# Patient Record
Sex: Female | Born: 1950 | Race: White | Hispanic: No | Marital: Single | Smoking: Never smoker
Health system: Southern US, Community
[De-identification: ages and names within clinical notes are randomized; demographics above are authoritative.]

## PROBLEM LIST (undated history)

## (undated) DIAGNOSIS — G43909 Migraine, unspecified, not intractable, without status migrainosus: Secondary | ICD-10-CM

## (undated) DIAGNOSIS — F329 Major depressive disorder, single episode, unspecified: Secondary | ICD-10-CM

## (undated) DIAGNOSIS — F32A Depression, unspecified: Secondary | ICD-10-CM

## (undated) HISTORY — DX: Depression, unspecified: F32.A

## (undated) HISTORY — PX: TUBAL LIGATION: SHX77

## (undated) HISTORY — DX: Migraine, unspecified, not intractable, without status migrainosus: G43.909

## (undated) HISTORY — DX: Major depressive disorder, single episode, unspecified: F32.9

---

## 1998-07-01 ENCOUNTER — Encounter: Payer: Self-pay | Admitting: Internal Medicine

## 1998-07-01 ENCOUNTER — Emergency Department (HOSPITAL_COMMUNITY): Admission: EM | Admit: 1998-07-01 | Discharge: 1998-07-01 | Payer: Self-pay | Admitting: Emergency Medicine

## 2002-09-13 ENCOUNTER — Other Ambulatory Visit: Admission: RE | Admit: 2002-09-13 | Discharge: 2002-09-13 | Payer: Self-pay | Admitting: *Deleted

## 2004-09-16 ENCOUNTER — Other Ambulatory Visit: Admission: RE | Admit: 2004-09-16 | Discharge: 2004-09-16 | Payer: Self-pay | Admitting: *Deleted

## 2012-11-01 ENCOUNTER — Ambulatory Visit (INDEPENDENT_AMBULATORY_CARE_PROVIDER_SITE_OTHER): Payer: BC Managed Care – PPO | Admitting: Emergency Medicine

## 2012-11-01 ENCOUNTER — Ambulatory Visit: Payer: BC Managed Care – PPO

## 2012-11-01 VITALS — BP 106/68 | HR 64 | Temp 98.2°F | Resp 16 | Ht 64.5 in | Wt 162.0 lb

## 2012-11-01 DIAGNOSIS — M549 Dorsalgia, unspecified: Secondary | ICD-10-CM

## 2012-11-01 DIAGNOSIS — M4310 Spondylolisthesis, site unspecified: Secondary | ICD-10-CM

## 2012-11-01 DIAGNOSIS — Q762 Congenital spondylolisthesis: Secondary | ICD-10-CM

## 2012-11-01 DIAGNOSIS — M62838 Other muscle spasm: Secondary | ICD-10-CM

## 2012-11-01 MED ORDER — MELOXICAM 15 MG PO TABS: 15.0000 mg | ORAL_TABLET | Freq: Every day | ORAL | Status: DC

## 2012-11-01 MED ORDER — CYCLOBENZAPRINE HCL 5 MG PO TABS: 5.0000 mg | ORAL_TABLET | Freq: Three times a day (TID) | ORAL | Status: DC | PRN

## 2012-11-01 NOTE — Patient Instructions (Signed)
Take the Mobic in the morning with food.  Use Flexeril at night before bedtime because it will make you drowsy.  We will refer you to an orthopedist and call you once we have an appointment set up for you.  If you have any questions in the meantime do not hesitate to call us.

## 2012-11-01 NOTE — Progress Notes (Signed)
I have reviewed the chart and examined patient and agree with assessment and plan.

## 2012-11-01 NOTE — Progress Notes (Signed)
Subjective:    Patient ID: Anne Nelson, female    DOB: 1951/05/04, 62 y.o.   MRN: [id number]  HPI  A 62 year old female presents with LBP for 1 week.  Pt denies any known injury or new activities in her life.  Pain was not sudden or sharp but more gradual with a constant dull pain.  Pain has progressively gotten worse.  She says her pain is worse in the morning and when she sits for long periods of time but tends to get better with activity.  The pain is starting to interfere with her ADLs.  Her pain is starting to radiate into her left anterior thigh and into her right and left groin.  Denies prior back injuries/surgeries, n/t, waking at night due to pain, knee pain.      Past Medical History  Diagnosis Date  . Depression   . Migraine     Past Surgical History  Procedure Laterality Date  . Tubal ligation    . Cesarean section      Prior to Admission medications   Medication Sig Start Date End Date Taking? Authorizing Provider  FLUoxetine (PROZAC) 20 MG capsule Take 20 mg by mouth daily.   Yes Historical Provider, MD    Allergies  Allergen Reactions  . Codeine Nausea Only    History   Social History  . Marital Status: Single    Spouse Name: N/A    Number of Children: 3  . Years of Education: 39   Occupational History  . Accountant    Social History Main Topics  . Smoking status: Never Smoker   . Smokeless tobacco: Not on file  . Alcohol Use: No  . Drug Use: No  . Sexually Active: Yes -- Female partner(s)   Other Topics Concern  . Not on file   Social History Narrative  . No narrative on file    Family History  Problem Relation Age of Onset  . Stroke Father   . Stroke Maternal Grandmother   . Stroke Maternal Grandfather   . Heart disease Paternal Grandfather      Review of Systems   As above. Objective:   Physical Exam  BP 106/68  Pulse 64  Temp(Src) 98.2 F (36.8 C) (Oral)  Resp 16  Ht 5' 4.5" (1.638 m)  Wt 162 lb (73.483 kg)  BMI  27.39 kg/m2  SpO2 99%  LMP 03/30/2006  General:  Pleasant, overweight female.  NAD. MSK:  Bilateral 2+ patellar and achilles DTRs.  Full ROM of knees, hip joints, and lumbar back.  Negative pinpoint spinal tenderness.  Tenderness lumbar paraspinal muscles.  Negative straight leg raise.   Peripheral vascular:  Bilateral 2+ dorsalis pedis pulses.  No c/c/e. Heart:  RRR.  Normal S1,S2.  No m/g/r. Lungs:  CTAB. Skin:  Warm and moist.  No ecchymoses.   [facility] reading (PRIMARY) by  Dr. Cleta Alberts.  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      Assessment & Plan:  Back pain - Plan: DG Lumbar Spine Complete, meloxicam (MOBIC) 15 MG tablet  Muscle spasm - Plan: cyclobenzaprine (FLEXERIL) 5 MG tablet  Spondylolisthesis  Patient Instructions  Take the Mobic in the morning with food.  Use Flexeril at night before bedtime because it will make you drowsy.  We will refer you to an orthopedist and call you once we have an appointment set up for you.  If you have any questions in the meantime do not hesitate to call us.

## 2013-04-23 ENCOUNTER — Emergency Department (HOSPITAL_COMMUNITY): Payer: BC Managed Care – PPO

## 2013-04-23 ENCOUNTER — Encounter (HOSPITAL_COMMUNITY): Payer: Self-pay | Admitting: Emergency Medicine

## 2013-04-23 ENCOUNTER — Emergency Department (HOSPITAL_COMMUNITY)
Admission: EM | Admit: 2013-04-23 | Discharge: 2013-04-23 | Disposition: A | Discharge status: Home / Self Care | Payer: BC Managed Care – PPO | Attending: Emergency Medicine | Admitting: Emergency Medicine

## 2013-04-23 DIAGNOSIS — F329 Major depressive disorder, single episode, unspecified: Secondary | ICD-10-CM | POA: Insufficient documentation

## 2013-04-23 DIAGNOSIS — Z79899 Other long term (current) drug therapy: Secondary | ICD-10-CM | POA: Insufficient documentation

## 2013-04-23 DIAGNOSIS — F3289 Other specified depressive episodes: Secondary | ICD-10-CM | POA: Insufficient documentation

## 2013-04-23 DIAGNOSIS — R0789 Other chest pain: Secondary | ICD-10-CM

## 2013-04-23 DIAGNOSIS — F411 Generalized anxiety disorder: Secondary | ICD-10-CM | POA: Insufficient documentation

## 2013-04-23 DIAGNOSIS — Z8679 Personal history of other diseases of the circulatory system: Secondary | ICD-10-CM | POA: Insufficient documentation

## 2013-04-23 LAB — POCT I-STAT TROPONIN I
Troponin i, poc: 0 ng/mL (ref 0.00–0.08)
Troponin i, poc: 0.01 ng/mL (ref 0.00–0.08)

## 2013-04-23 LAB — CBC WITH DIFFERENTIAL/PLATELET
Basophils Absolute: 0.1 10*3/uL (ref 0.0–0.1)
Basophils Relative: 1 % (ref 0–1)
Eosinophils Absolute: 0.1 10*3/uL (ref 0.0–0.7)
Eosinophils Relative: 3 % (ref 0–5)
HCT: 36.8 % (ref 36.0–46.0)
Hemoglobin: 12.4 g/dL (ref 12.0–15.0)
Lymphocytes Relative: 37 % (ref 12–46)
Lymphs Abs: 1.7 10*3/uL (ref 0.7–4.0)
MCH: 29 pg (ref 26.0–34.0)
MCHC: 33.7 g/dL (ref 30.0–36.0)
MCV: 86 fL (ref 78.0–100.0)
Monocytes Absolute: 0.5 10*3/uL (ref 0.1–1.0)
Monocytes Relative: 10 % (ref 3–12)
Neutro Abs: 2.2 10*3/uL (ref 1.7–7.7)
Neutrophils Relative %: 49 % (ref 43–77)
Platelets: 158 10*3/uL (ref 150–400)
RBC: 4.28 MIL/uL (ref 3.87–5.11)
RDW: 13.1 % (ref 11.5–15.5)
WBC: 4.6 10*3/uL (ref 4.0–10.5)

## 2013-04-23 LAB — BASIC METABOLIC PANEL
BUN: 17 mg/dL (ref 6–23)
CO2: 25 mEq/L (ref 19–32)
Calcium: 8.7 mg/dL (ref 8.4–10.5)
Chloride: 103 mEq/L (ref 96–112)
Creatinine, Ser: 0.82 mg/dL (ref 0.50–1.10)
GFR calc Af Amer: 89 mL/min — ABNORMAL LOW (ref >90)
GFR calc non Af Amer: 77 mL/min — ABNORMAL LOW (ref >90)
Glucose, Bld: 94 mg/dL (ref 70–99)
Potassium: 3.9 mEq/L (ref 3.5–5.1)
Sodium: 138 mEq/L (ref 135–145)

## 2013-04-23 MED ORDER — MORPHINE SULFATE 4 MG/ML IJ SOLN
4.0000 mg | Freq: Once | INTRAMUSCULAR | Status: AC
  Administered 2013-04-23: 4 mg via INTRAVENOUS
  Filled 2013-04-23: qty 1

## 2013-04-23 NOTE — ED Provider Notes (Signed)
CSN: [id number]     Arrival date & time 04/23/13  2130 History     First MD Initiated Contact with Patient 04/23/13 [id number]     Chief Complaint  Patient presents with  . Chest Pain   (Consider location/radiation/quality/duration/timing/severity/associated sxs/prior Treatment) Patient is a 62 y.o. female presenting with chest pain. The history is provided by the patient.  Chest Pain Pain location:  L chest, R chest, L lateral chest and R lateral chest Pain quality: sharp   Pain quality: not burning, not dull, not radiating, not shooting and not tearing   Pain radiates to:  Does not radiate Pain radiates to the back: no   Pain severity:  Moderate Onset quality:  Sudden Timing:  Constant Progression:  Improving Chronicity:  New Context: at rest   Context: not breathing, no drug use, not eating, no intercourse, not lifting, no movement, not raising an arm, no stress and no trauma   Relieved by:  Nothing Worsened by:  Nothing tried Ineffective treatments:  None tried Associated symptoms: no abdominal pain, no altered mental status, no anorexia, no anxiety, no back pain, no claudication, no cough, no diaphoresis, no dizziness, no fever, no headache, no heartburn, no lower extremity edema, no nausea, no near-syncope, no numbness, no palpitations, no shortness of breath, no syncope, not vomiting and no weakness   Risk factors: no aortic disease, no birth control, no hypertension, no immobilization, no smoking and no surgery    Pt reports waking up at 5:30 AM this morning to sudden onset of sharp constant band like pain across chest. Pain does not radiate anywhere and was 6/10 and has since improved to 3/10. Nothing makes the pain better or worse. She has never had an episode like this before. Pt denies recent travel, surgery, or change in activity level.    Pt complains of 3-4 mo history of chest pressure. Episodes last up to 30 min. Denies any other associated sx including palpitations,  sweating, dyspnea, LOC, visual changes, dizziness, swelling in leg, sensory changes. Pt reports a stressful job and episodes usually occur at work or driving to work. Nothing makes the episodes better or worse.  Past Medical History  Diagnosis Date  . Depression   . Migraine    Past Surgical History  Procedure Laterality Date  . Tubal ligation    . Cesarean section     Family History  Problem Relation Age of Onset  . Stroke Father   . Stroke Maternal Grandmother   . Stroke Maternal Grandfather   . Heart disease Paternal Grandfather    History  Substance Use Topics  . Smoking status: Never Smoker   . Smokeless tobacco: Not on file  . Alcohol Use: No   OB History   Grav Para Term Preterm Abortions TAB SAB Ect Mult Living                 Review of Systems  Constitutional: Negative for fever, diaphoresis and activity change.  HENT: Negative for congestion and sinus pressure.   Respiratory: Negative for cough and shortness of breath.   Cardiovascular: Positive for chest pain. Negative for palpitations, claudication, leg swelling, syncope and near-syncope.  Gastrointestinal: Negative for heartburn, nausea, vomiting, abdominal pain and anorexia.  Musculoskeletal: Negative for back pain.  Neurological: Negative for dizziness, syncope, weakness, light-headedness, numbness and headaches.  Psychiatric/Behavioral: The patient is not nervous/anxious.     Allergies  Codeine  Home Medications   Current Outpatient Rx  Name  Route  Sig  Dispense  Refill  . FLUoxetine (PROZAC) 20 MG capsule   Oral   Take 20 mg by mouth daily.          BP 144/76  Pulse 80  Temp(Src) 98.2 F (36.8 C) (Oral)  Resp 17  Wt 164 lb (74.39 kg)  BMI 27.73 kg/m2  SpO2 100%  LMP 03/30/2006 Physical Exam  Constitutional: She is oriented to person, place, and time. She appears well-developed and well-nourished.  HENT:  Head: Normocephalic and atraumatic.  Eyes: EOM are normal. Pupils are equal,  round, and reactive to light.  Neck: Trachea normal, normal range of motion and full passive range of motion without pain. Neck supple. Normal carotid pulses present. Carotid bruit is not present. No edema present.  Cardiovascular: Normal rate, regular rhythm, S1 normal, S2 normal, normal heart sounds and normal pulses.  Exam reveals no distant heart sounds and no friction rub.   No murmur heard. Pulmonary/Chest: Effort normal and breath sounds normal. No respiratory distress. She has no decreased breath sounds. She has no wheezes. She has no rales. She exhibits no mass, no tenderness, no bony tenderness, no deformity and no swelling.  Abdominal: Soft. She exhibits no distension. There is no tenderness. There is no rigidity, no guarding and no CVA tenderness.  Musculoskeletal: She exhibits no edema and no tenderness.  Neurological: She is alert and oriented to person, place, and time. She has normal reflexes.  Skin: Skin is warm and intact.  Psychiatric: Her behavior is normal. Judgment and thought content normal. Her mood appears anxious. She does not exhibit a depressed mood.    ED Course   Procedures (including critical care time)  Labs Reviewed  BASIC METABOLIC PANEL - Abnormal; Notable for the following:    GFR calc non Af Amer 77 (*)    GFR calc Af Amer 89 (*)    All other components within normal limits  CBC WITH DIFFERENTIAL  POCT I-STAT TROPONIN I   Dg Chest 2 View  04/23/2013   *RADIOLOGY REPORT*  Clinical Data: Chest pain.  CHEST - 2 VIEW  Comparison: No priors.  Findings: Lung volumes are normal.  No consolidative airspace disease.  No pleural effusions.  No pneumothorax.  No pulmonary nodule or mass noted.  Pulmonary vasculature and the cardiomediastinal silhouette are within normal limits.  IMPRESSION: 1. No radiographic evidence of acute cardiopulmonary disease.   Original Report Authenticated By: Trudie Reed, M.D.   patient has had 2 sets of negative enzymes.  Onset of  pain was greater than 6 hours ago.  Patient is PERC negative and I will have her followup with a primary Dr. for further evaluation.  The patient's pain was constant, but has improved at this time.  Patient is advised return here as needed for any worsening in her condition MDM   Date: 04/23/2013  Rate: *74  Rhythm: normal sinus rhythm  QRS Axis: normal  Intervals: normal  ST/T Wave abnormalities: normal  Conduction Disutrbances:none  Narrative Interpretation:   Old EKG Reviewed: unchanged  MDM Reviewed: vitals and nursing note Interpretation: labs, ECG and x-ray      Carlyle Dolly, PA-C 04/23/13 1145

## 2013-04-23 NOTE — ED Notes (Signed)
Patient transported to X-ray 

## 2013-04-23 NOTE — ED Notes (Signed)
EKG done in triage

## 2013-04-23 NOTE — ED Notes (Signed)
Denies heavy lifting or cough

## 2013-04-23 NOTE — ED Notes (Signed)
Pt arrived to ED via POV  From home with a complaint of chest pain.  Pt describes chest pain as a sharp band across her chest.  Pt denies pain at the present time.  Pt states that at the time the pain rated at 7 out of 10.  Pt denies any radiation with the pain.

## 2013-04-25 NOTE — ED Provider Notes (Signed)
Medical screening examination/treatment/procedure(s) were performed by non-physician practitioner and as supervising physician I was immediately available for consultation/collaboration.    Christopher J. Pollina, MD 04/25/13 1640 

## 2014-07-01 IMAGING — CR DG CHEST 2V
2 series · 2 of 2 positions shown · non-contrast
Comparison: No priors.

CLINICAL DATA: Chest pain.

CHEST - 2 VIEW

[w chest pa]
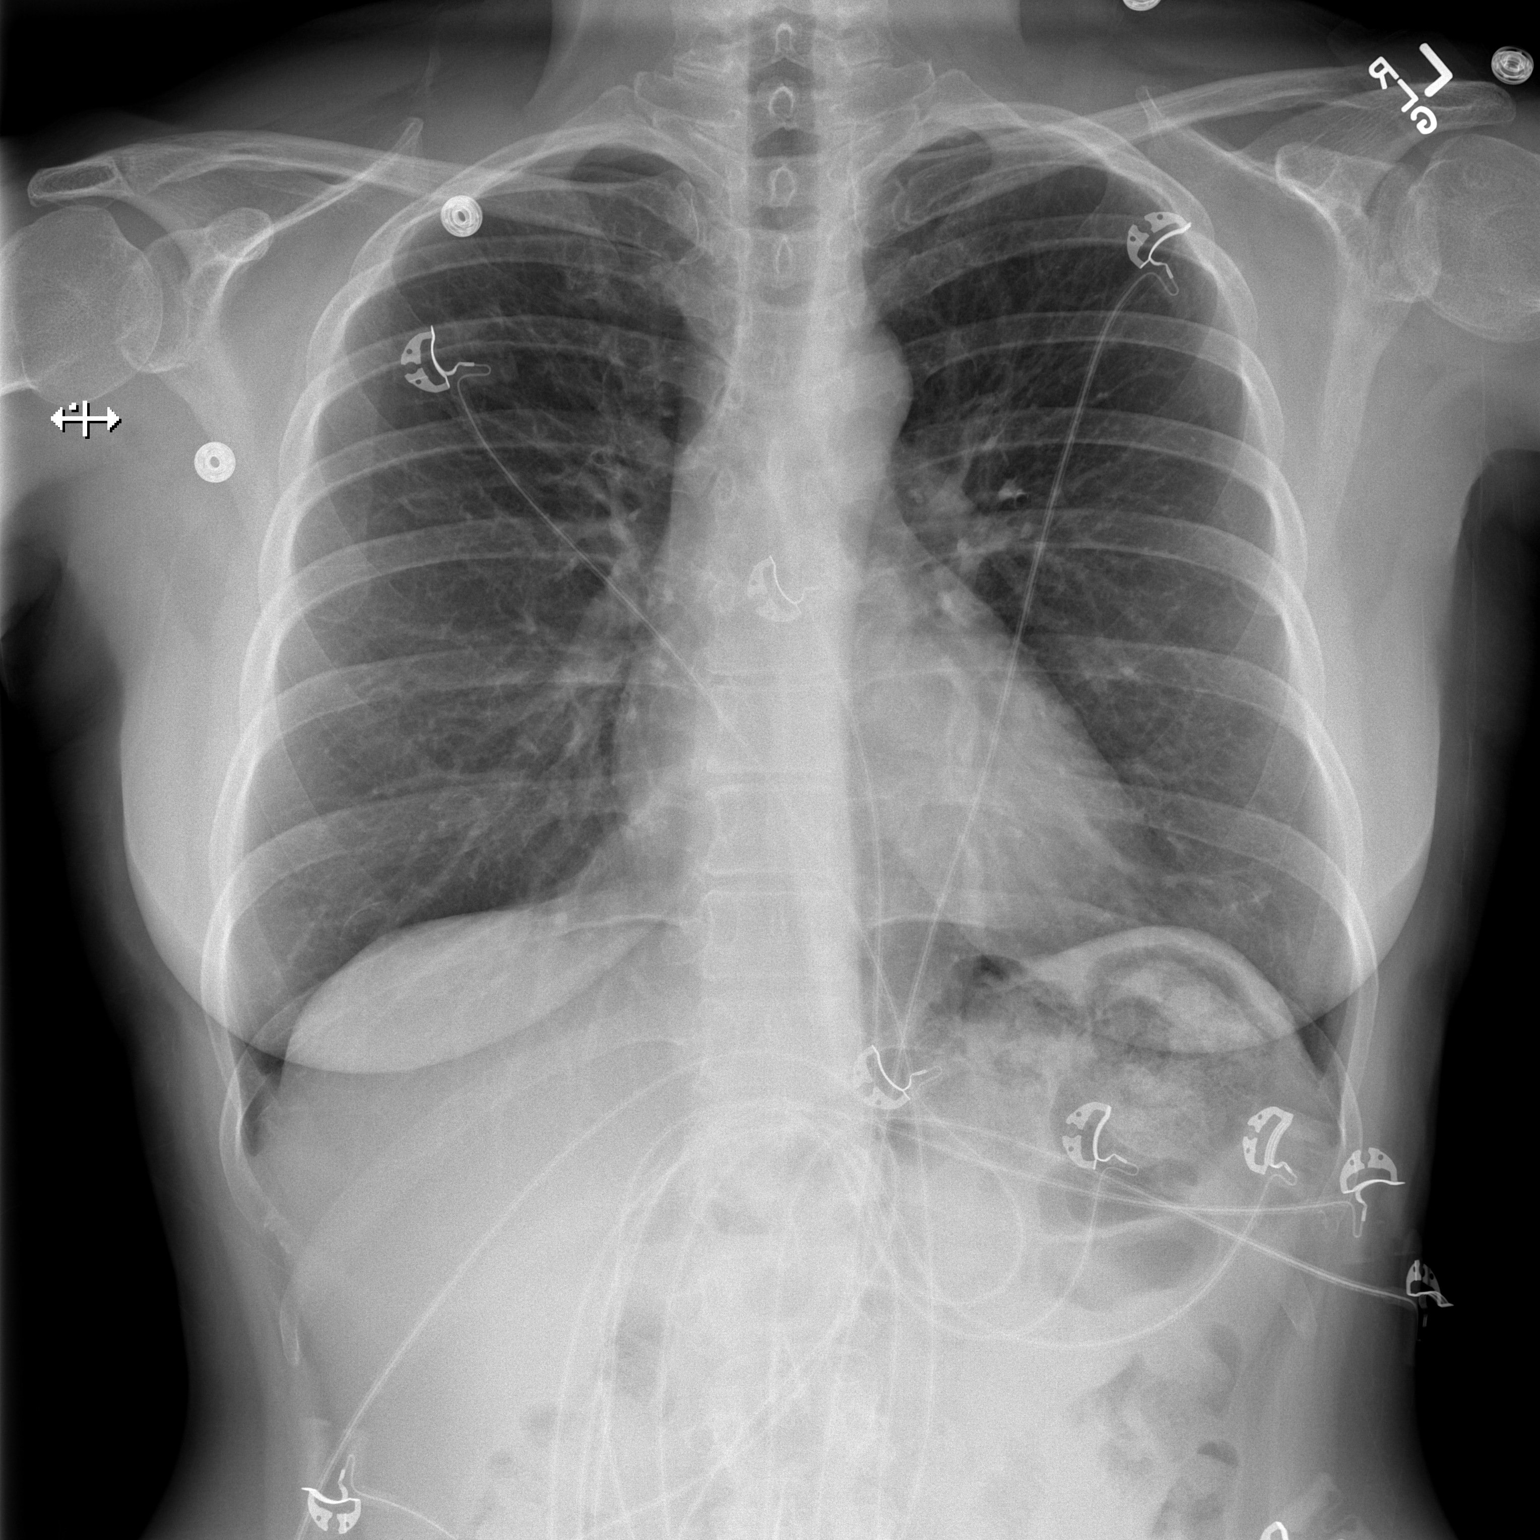

[w chest lat]
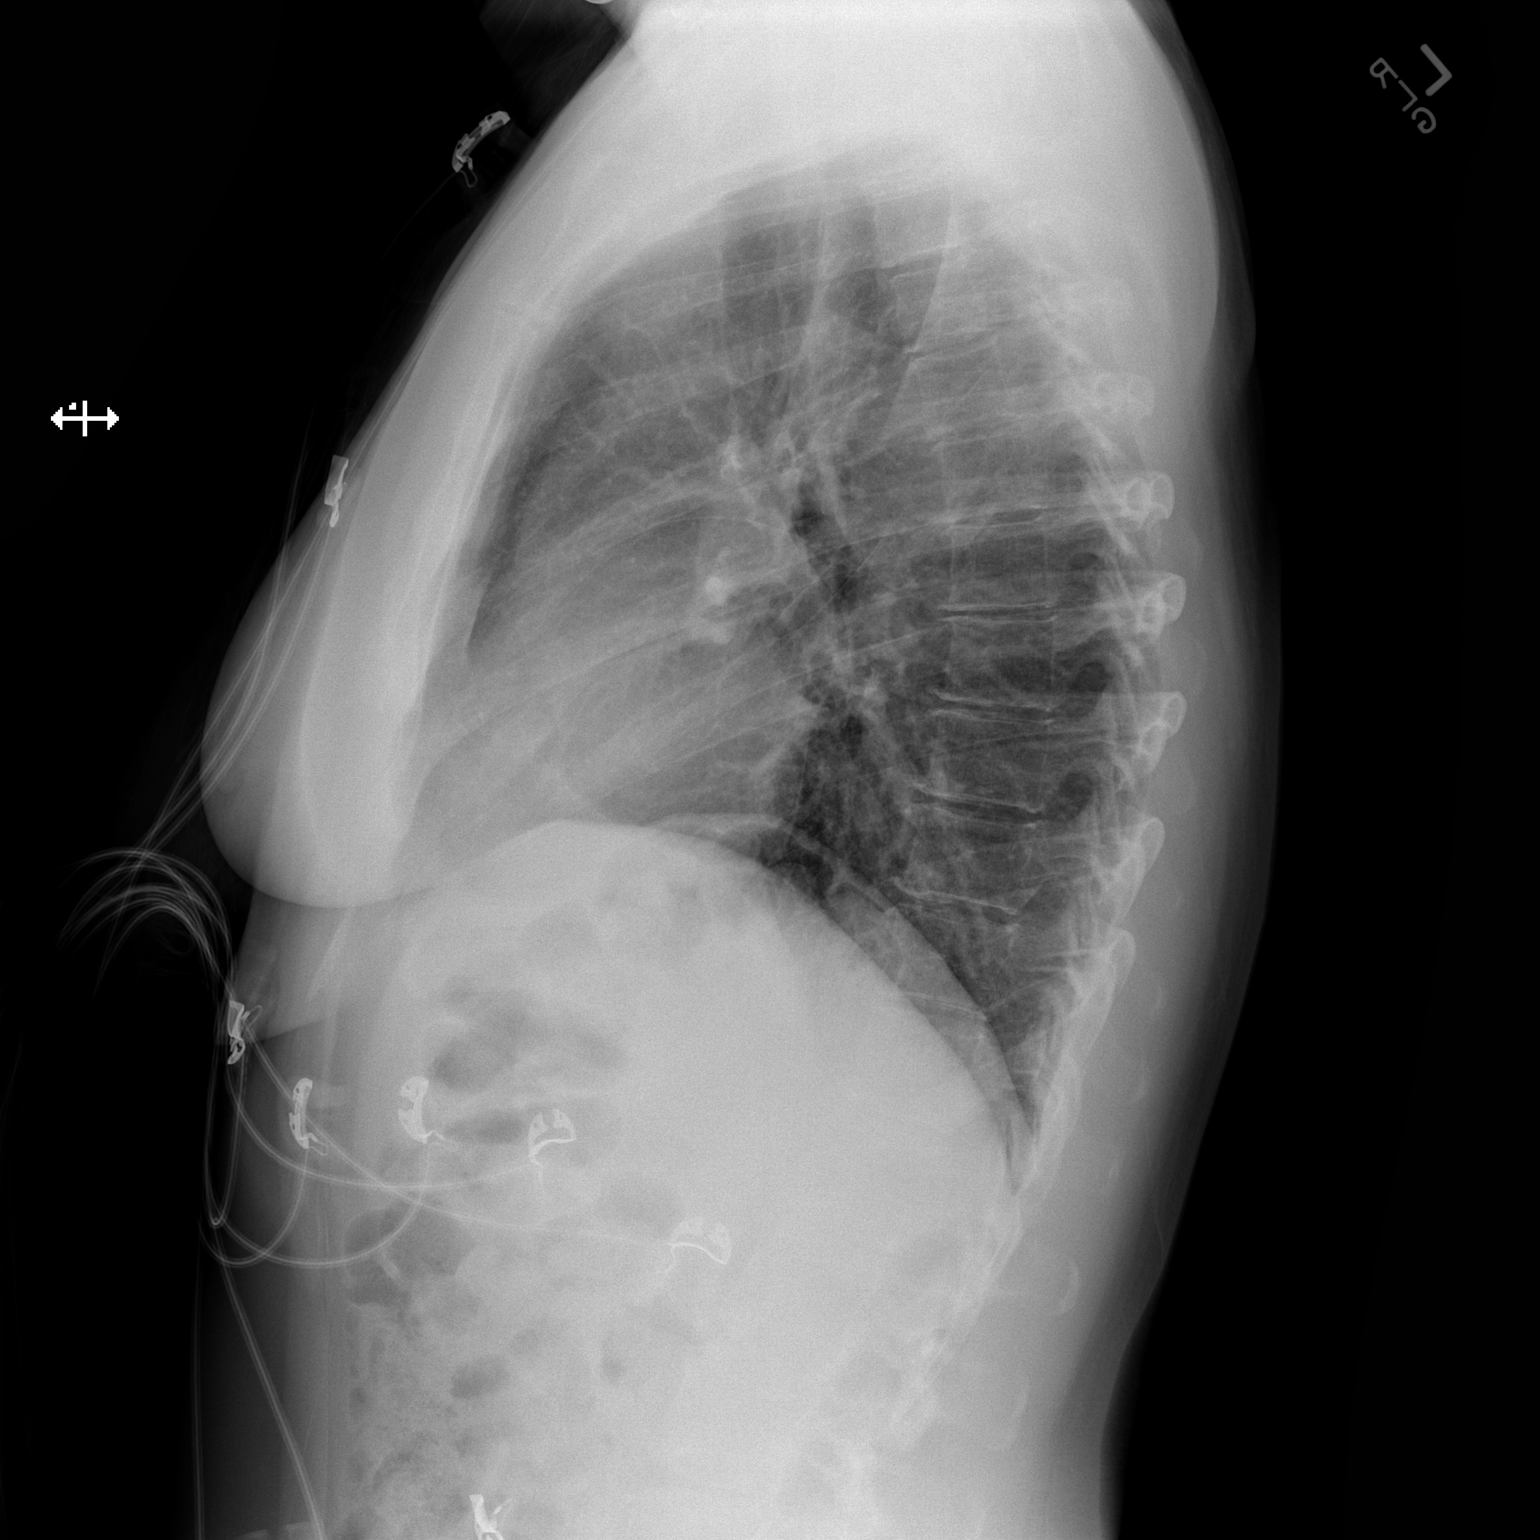

[2 of 2 positions shown; findings below may reference images not displayed]

FINDINGS: Lung volumes are normal.  No consolidative airspace
disease.  No pleural effusions.  No pneumothorax.  No pulmonary
nodule or mass noted.  Pulmonary vasculature and the
cardiomediastinal silhouette are within normal limits.
IMPRESSION: 1. No radiographic evidence of acute cardiopulmonary disease.

## 2016-07-20 DIAGNOSIS — H5213 Myopia, bilateral: Secondary | ICD-10-CM | POA: Diagnosis not present

## 2016-07-20 DIAGNOSIS — H25013 Cortical age-related cataract, bilateral: Secondary | ICD-10-CM | POA: Diagnosis not present

## 2016-07-20 DIAGNOSIS — H2513 Age-related nuclear cataract, bilateral: Secondary | ICD-10-CM | POA: Diagnosis not present

## 2016-11-11 DIAGNOSIS — R6889 Other general symptoms and signs: Secondary | ICD-10-CM | POA: Diagnosis not present

## 2016-11-11 DIAGNOSIS — F329 Major depressive disorder, single episode, unspecified: Secondary | ICD-10-CM | POA: Diagnosis not present

## 2016-11-11 DIAGNOSIS — R5383 Other fatigue: Secondary | ICD-10-CM | POA: Diagnosis not present

## 2016-12-22 DIAGNOSIS — Z13220 Encounter for screening for lipoid disorders: Secondary | ICD-10-CM | POA: Diagnosis not present

## 2016-12-27 ENCOUNTER — Other Ambulatory Visit (HOSPITAL_COMMUNITY)
Admission: RE | Admit: 2016-12-27 | Discharge: 2016-12-27 | Disposition: A | Discharge status: Home / Self Care | Payer: BLUE CROSS/BLUE SHIELD | Source: Ambulatory Visit | Attending: Physician Assistant | Admitting: Physician Assistant

## 2016-12-27 ENCOUNTER — Other Ambulatory Visit: Payer: Self-pay | Admitting: Physician Assistant

## 2016-12-27 DIAGNOSIS — Z23 Encounter for immunization: Secondary | ICD-10-CM | POA: Diagnosis not present

## 2016-12-27 DIAGNOSIS — Z124 Encounter for screening for malignant neoplasm of cervix: Secondary | ICD-10-CM | POA: Diagnosis not present

## 2016-12-27 DIAGNOSIS — Z0000 Encounter for general adult medical examination without abnormal findings: Secondary | ICD-10-CM | POA: Diagnosis not present

## 2016-12-28 ENCOUNTER — Other Ambulatory Visit: Payer: Self-pay | Admitting: Physician Assistant

## 2016-12-28 DIAGNOSIS — Z1231 Encounter for screening mammogram for malignant neoplasm of breast: Secondary | ICD-10-CM

## 2016-12-28 DIAGNOSIS — R5381 Other malaise: Secondary | ICD-10-CM

## 2016-12-30 LAB — CYTOLOGY - PAP
DIAGNOSIS: NEGATIVE
HPV: NOT DETECTED

## 2017-03-10 ENCOUNTER — Other Ambulatory Visit: Payer: Self-pay | Admitting: Physician Assistant

## 2017-03-10 DIAGNOSIS — Z780 Asymptomatic menopausal state: Secondary | ICD-10-CM

## 2017-07-12 DIAGNOSIS — M1990 Unspecified osteoarthritis, unspecified site: Secondary | ICD-10-CM | POA: Diagnosis not present

## 2017-07-12 DIAGNOSIS — M7542 Impingement syndrome of left shoulder: Secondary | ICD-10-CM | POA: Diagnosis not present

## 2017-10-23 DIAGNOSIS — M19042 Primary osteoarthritis, left hand: Secondary | ICD-10-CM | POA: Diagnosis not present

## 2017-12-28 ENCOUNTER — Other Ambulatory Visit: Payer: Self-pay | Admitting: Family Medicine

## 2017-12-28 DIAGNOSIS — M19041 Primary osteoarthritis, right hand: Secondary | ICD-10-CM | POA: Diagnosis not present

## 2017-12-28 DIAGNOSIS — Z1211 Encounter for screening for malignant neoplasm of colon: Secondary | ICD-10-CM | POA: Diagnosis not present

## 2017-12-28 DIAGNOSIS — Z139 Encounter for screening, unspecified: Secondary | ICD-10-CM

## 2017-12-28 DIAGNOSIS — E2839 Other primary ovarian failure: Secondary | ICD-10-CM

## 2017-12-28 DIAGNOSIS — E559 Vitamin D deficiency, unspecified: Secondary | ICD-10-CM

## 2017-12-28 DIAGNOSIS — G43709 Chronic migraine without aura, not intractable, without status migrainosus: Secondary | ICD-10-CM | POA: Diagnosis not present

## 2017-12-28 DIAGNOSIS — Z0000 Encounter for general adult medical examination without abnormal findings: Secondary | ICD-10-CM | POA: Diagnosis not present

## 2019-06-18 DIAGNOSIS — H25013 Cortical age-related cataract, bilateral: Secondary | ICD-10-CM | POA: Diagnosis not present

## 2019-06-18 DIAGNOSIS — H524 Presbyopia: Secondary | ICD-10-CM | POA: Diagnosis not present

## 2019-06-18 DIAGNOSIS — H35372 Puckering of macula, left eye: Secondary | ICD-10-CM | POA: Diagnosis not present

## 2019-06-18 DIAGNOSIS — H04123 Dry eye syndrome of bilateral lacrimal glands: Secondary | ICD-10-CM | POA: Diagnosis not present

## 2019-07-05 DIAGNOSIS — H25011 Cortical age-related cataract, right eye: Secondary | ICD-10-CM | POA: Diagnosis not present

## 2019-07-05 DIAGNOSIS — H25811 Combined forms of age-related cataract, right eye: Secondary | ICD-10-CM | POA: Diagnosis not present

## 2019-07-05 DIAGNOSIS — H2511 Age-related nuclear cataract, right eye: Secondary | ICD-10-CM | POA: Diagnosis not present

## 2019-07-05 DIAGNOSIS — H52221 Regular astigmatism, right eye: Secondary | ICD-10-CM | POA: Diagnosis not present

## 2019-07-19 DIAGNOSIS — H25012 Cortical age-related cataract, left eye: Secondary | ICD-10-CM | POA: Diagnosis not present

## 2019-07-19 DIAGNOSIS — H2512 Age-related nuclear cataract, left eye: Secondary | ICD-10-CM | POA: Diagnosis not present

## 2019-07-19 DIAGNOSIS — H25812 Combined forms of age-related cataract, left eye: Secondary | ICD-10-CM | POA: Diagnosis not present

## 2019-07-19 DIAGNOSIS — H52222 Regular astigmatism, left eye: Secondary | ICD-10-CM | POA: Diagnosis not present

## 2019-08-06 DIAGNOSIS — Z0000 Encounter for general adult medical examination without abnormal findings: Secondary | ICD-10-CM | POA: Diagnosis not present

## 2019-08-06 DIAGNOSIS — Z23 Encounter for immunization: Secondary | ICD-10-CM | POA: Diagnosis not present

## 2019-08-08 ENCOUNTER — Other Ambulatory Visit: Payer: Self-pay | Admitting: Family Medicine

## 2019-08-08 DIAGNOSIS — Z13220 Encounter for screening for lipoid disorders: Secondary | ICD-10-CM | POA: Diagnosis not present

## 2019-08-08 DIAGNOSIS — Z0000 Encounter for general adult medical examination without abnormal findings: Secondary | ICD-10-CM | POA: Diagnosis not present

## 2019-08-08 DIAGNOSIS — E2839 Other primary ovarian failure: Secondary | ICD-10-CM

## 2019-08-27 DIAGNOSIS — Z20828 Contact with and (suspected) exposure to other viral communicable diseases: Secondary | ICD-10-CM | POA: Diagnosis not present

## 2019-09-13 DIAGNOSIS — Z20828 Contact with and (suspected) exposure to other viral communicable diseases: Secondary | ICD-10-CM | POA: Diagnosis not present

## 2019-11-04 ENCOUNTER — Ambulatory Visit: Payer: BC Managed Care – PPO | Attending: Internal Medicine

## 2019-11-04 DIAGNOSIS — Z23 Encounter for immunization: Secondary | ICD-10-CM | POA: Insufficient documentation

## 2019-11-04 NOTE — Progress Notes (Signed)
   Covid-19 Vaccination Clinic  Name:  Anne Nelson    MRN: [id number] DOB: 06/26/1951  11/04/2019  [name] was observed post Covid-19 immunization for 15 minutes without incident. She was provided with Vaccine Information Sheet and instruction to access the V-Safe system.   [name] was instructed to call 911 with any severe reactions post vaccine: Marland Kitchen Difficulty breathing  . Swelling of face and throat  . A fast heartbeat  . A bad rash all over body  . Dizziness and weakness   Immunizations Administered    Name Date Dose VIS Date Route   Pfizer COVID-19 Vaccine 11/04/2019  8:11 AM 0.3 mL 08/10/2019 Intramuscular   Manufacturer: [employer], [employer]   Lot: [id number]   NDC: [address]-2

## 2019-12-04 ENCOUNTER — Ambulatory Visit: Payer: BC Managed Care – PPO | Attending: Internal Medicine

## 2019-12-04 DIAGNOSIS — Z23 Encounter for immunization: Secondary | ICD-10-CM

## 2019-12-04 NOTE — Progress Notes (Signed)
   Covid-19 Vaccination Clinic  Name:  Anne Nelson    MRN: [id number] DOB: Jun 22, 1951  12/04/2019  Anne Nelson was observed post Covid-19 immunization for 15 minutes without incident. She was provided with Vaccine Information Sheet and instruction to access the V-Safe system.   Anne Nelson was instructed to call 911 with any severe reactions post vaccine: Marland Kitchen Difficulty breathing  . Swelling of face and throat  . A fast heartbeat  . A bad rash all over body  . Dizziness and weakness   Immunizations Administered    Name Date Dose VIS Date Route   Pfizer COVID-19 Vaccine 12/04/2019 10:02 AM 0.3 mL 08/10/2019 Intramuscular   Manufacturer: [employer], [employer]   Lot: [id number]   NDC: [address]-1

## 2020-07-30 DIAGNOSIS — H43813 Vitreous degeneration, bilateral: Secondary | ICD-10-CM | POA: Diagnosis not present

## 2020-07-30 DIAGNOSIS — H26493 Other secondary cataract, bilateral: Secondary | ICD-10-CM | POA: Diagnosis not present

## 2020-07-30 DIAGNOSIS — H04122 Dry eye syndrome of left lacrimal gland: Secondary | ICD-10-CM | POA: Diagnosis not present

## 2020-08-13 DIAGNOSIS — Z13220 Encounter for screening for lipoid disorders: Secondary | ICD-10-CM | POA: Diagnosis not present

## 2020-08-13 DIAGNOSIS — Z0000 Encounter for general adult medical examination without abnormal findings: Secondary | ICD-10-CM | POA: Diagnosis not present

## 2020-08-13 DIAGNOSIS — E559 Vitamin D deficiency, unspecified: Secondary | ICD-10-CM | POA: Diagnosis not present

## 2020-08-13 DIAGNOSIS — E2839 Other primary ovarian failure: Secondary | ICD-10-CM | POA: Diagnosis not present

## 2020-08-13 DIAGNOSIS — F329 Major depressive disorder, single episode, unspecified: Secondary | ICD-10-CM | POA: Diagnosis not present

## 2020-08-13 DIAGNOSIS — Z23 Encounter for immunization: Secondary | ICD-10-CM | POA: Diagnosis not present

## 2020-09-04 ENCOUNTER — Other Ambulatory Visit: Payer: Self-pay | Admitting: Family Medicine

## 2020-09-04 DIAGNOSIS — E559 Vitamin D deficiency, unspecified: Secondary | ICD-10-CM

## 2020-09-04 DIAGNOSIS — Z1231 Encounter for screening mammogram for malignant neoplasm of breast: Secondary | ICD-10-CM

## 2020-09-04 DIAGNOSIS — E2839 Other primary ovarian failure: Secondary | ICD-10-CM

## 2020-09-08 DIAGNOSIS — F329 Major depressive disorder, single episode, unspecified: Secondary | ICD-10-CM | POA: Diagnosis not present

## 2020-11-27 DIAGNOSIS — R5383 Other fatigue: Secondary | ICD-10-CM | POA: Diagnosis not present

## 2020-11-27 DIAGNOSIS — F329 Major depressive disorder, single episode, unspecified: Secondary | ICD-10-CM | POA: Diagnosis not present

## 2021-02-19 DIAGNOSIS — F329 Major depressive disorder, single episode, unspecified: Secondary | ICD-10-CM | POA: Diagnosis not present

## 2021-08-04 DIAGNOSIS — H43813 Vitreous degeneration, bilateral: Secondary | ICD-10-CM | POA: Diagnosis not present

## 2021-08-04 DIAGNOSIS — H52203 Unspecified astigmatism, bilateral: Secondary | ICD-10-CM | POA: Diagnosis not present

## 2021-08-04 DIAGNOSIS — H0100A Unspecified blepharitis right eye, upper and lower eyelids: Secondary | ICD-10-CM | POA: Diagnosis not present

## 2021-08-04 DIAGNOSIS — H26493 Other secondary cataract, bilateral: Secondary | ICD-10-CM | POA: Diagnosis not present

## 2021-09-24 DIAGNOSIS — Z1211 Encounter for screening for malignant neoplasm of colon: Secondary | ICD-10-CM | POA: Diagnosis not present

## 2021-09-24 DIAGNOSIS — Z13220 Encounter for screening for lipoid disorders: Secondary | ICD-10-CM | POA: Diagnosis not present

## 2021-09-24 DIAGNOSIS — E2839 Other primary ovarian failure: Secondary | ICD-10-CM | POA: Diagnosis not present

## 2021-09-24 DIAGNOSIS — Z0000 Encounter for general adult medical examination without abnormal findings: Secondary | ICD-10-CM | POA: Diagnosis not present

## 2021-09-24 DIAGNOSIS — Z124 Encounter for screening for malignant neoplasm of cervix: Secondary | ICD-10-CM | POA: Diagnosis not present

## 2021-09-24 DIAGNOSIS — Z23 Encounter for immunization: Secondary | ICD-10-CM | POA: Diagnosis not present

## 2021-09-24 DIAGNOSIS — E559 Vitamin D deficiency, unspecified: Secondary | ICD-10-CM | POA: Diagnosis not present

## 2021-09-24 DIAGNOSIS — F329 Major depressive disorder, single episode, unspecified: Secondary | ICD-10-CM | POA: Diagnosis not present

## 2021-09-30 ENCOUNTER — Other Ambulatory Visit: Payer: Self-pay | Admitting: Family Medicine

## 2021-09-30 DIAGNOSIS — E2839 Other primary ovarian failure: Secondary | ICD-10-CM

## 2022-10-29 DIAGNOSIS — H26493 Other secondary cataract, bilateral: Secondary | ICD-10-CM | POA: Diagnosis not present

## 2022-10-29 DIAGNOSIS — H52201 Unspecified astigmatism, right eye: Secondary | ICD-10-CM | POA: Diagnosis not present

## 2022-10-29 DIAGNOSIS — H524 Presbyopia: Secondary | ICD-10-CM | POA: Diagnosis not present

## 2022-10-29 DIAGNOSIS — H43813 Vitreous degeneration, bilateral: Secondary | ICD-10-CM | POA: Diagnosis not present

## 2022-11-09 DIAGNOSIS — H26491 Other secondary cataract, right eye: Secondary | ICD-10-CM | POA: Diagnosis not present

## 2022-11-23 DIAGNOSIS — E559 Vitamin D deficiency, unspecified: Secondary | ICD-10-CM | POA: Diagnosis not present

## 2022-11-23 DIAGNOSIS — Z13220 Encounter for screening for lipoid disorders: Secondary | ICD-10-CM | POA: Diagnosis not present

## 2022-11-23 DIAGNOSIS — Z0000 Encounter for general adult medical examination without abnormal findings: Secondary | ICD-10-CM | POA: Diagnosis not present

## 2022-11-23 DIAGNOSIS — E2839 Other primary ovarian failure: Secondary | ICD-10-CM | POA: Diagnosis not present

## 2023-01-19 DIAGNOSIS — Z1231 Encounter for screening mammogram for malignant neoplasm of breast: Secondary | ICD-10-CM | POA: Diagnosis not present

## 2023-01-19 DIAGNOSIS — E349 Endocrine disorder, unspecified: Secondary | ICD-10-CM | POA: Diagnosis not present

## 2023-01-19 DIAGNOSIS — E2839 Other primary ovarian failure: Secondary | ICD-10-CM | POA: Diagnosis not present

## 2023-01-19 DIAGNOSIS — N951 Menopausal and female climacteric states: Secondary | ICD-10-CM | POA: Diagnosis not present

## 2023-12-12 DIAGNOSIS — E2839 Other primary ovarian failure: Secondary | ICD-10-CM | POA: Diagnosis not present

## 2023-12-12 DIAGNOSIS — H9193 Unspecified hearing loss, bilateral: Secondary | ICD-10-CM | POA: Diagnosis not present

## 2023-12-12 DIAGNOSIS — Z136 Encounter for screening for cardiovascular disorders: Secondary | ICD-10-CM | POA: Diagnosis not present

## 2023-12-12 DIAGNOSIS — E559 Vitamin D deficiency, unspecified: Secondary | ICD-10-CM | POA: Diagnosis not present

## 2023-12-12 DIAGNOSIS — Z0000 Encounter for general adult medical examination without abnormal findings: Secondary | ICD-10-CM | POA: Diagnosis not present

## 2024-01-09 DIAGNOSIS — K080 Exfoliation of teeth due to systemic causes: Secondary | ICD-10-CM | POA: Diagnosis not present

## 2024-01-25 DIAGNOSIS — Z1231 Encounter for screening mammogram for malignant neoplasm of breast: Secondary | ICD-10-CM | POA: Diagnosis not present

## 2024-07-13 DIAGNOSIS — K080 Exfoliation of teeth due to systemic causes: Secondary | ICD-10-CM | POA: Diagnosis not present
